# Patient Record
Sex: Female | Born: 1980 | Race: Black or African American | Hispanic: No | Marital: Married | Smoking: Never smoker
Health system: Southern US, Community
[De-identification: ages and names within clinical notes are randomized; demographics above are authoritative.]

## PROBLEM LIST (undated history)

## (undated) HISTORY — PX: BREAST BIOPSY: SHX20

---

## 2021-02-05 ENCOUNTER — Other Ambulatory Visit: Payer: Self-pay | Admitting: Obstetrics and Gynecology

## 2021-02-05 DIAGNOSIS — Z803 Family history of malignant neoplasm of breast: Secondary | ICD-10-CM

## 2021-02-06 ENCOUNTER — Other Ambulatory Visit: Payer: Self-pay | Admitting: Obstetrics and Gynecology

## 2021-02-06 DIAGNOSIS — Z1231 Encounter for screening mammogram for malignant neoplasm of breast: Secondary | ICD-10-CM

## 2021-02-07 ENCOUNTER — Ambulatory Visit
Admission: RE | Admit: 2021-02-07 | Discharge: 2021-02-07 | Disposition: A | Discharge status: Home / Self Care | Payer: Managed Care, Other (non HMO) | Source: Ambulatory Visit | Attending: Obstetrics and Gynecology | Admitting: Obstetrics and Gynecology

## 2021-02-07 ENCOUNTER — Other Ambulatory Visit: Payer: Self-pay

## 2021-02-07 DIAGNOSIS — Z1231 Encounter for screening mammogram for malignant neoplasm of breast: Secondary | ICD-10-CM

## 2021-02-11 ENCOUNTER — Ambulatory Visit (INDEPENDENT_AMBULATORY_CARE_PROVIDER_SITE_OTHER): Payer: Managed Care, Other (non HMO) | Admitting: Physician Assistant

## 2021-02-11 ENCOUNTER — Other Ambulatory Visit: Payer: Self-pay

## 2021-02-11 ENCOUNTER — Encounter: Payer: Self-pay | Admitting: Physician Assistant

## 2021-02-11 VITALS — BP 118/76 | HR 78 | Temp 97.9°F | Ht 62.5 in | Wt 150.0 lb

## 2021-02-11 DIAGNOSIS — L918 Other hypertrophic disorders of the skin: Secondary | ICD-10-CM

## 2021-02-11 DIAGNOSIS — E663 Overweight: Secondary | ICD-10-CM | POA: Diagnosis not present

## 2021-02-11 NOTE — Progress Notes (Signed)
Andrea Rosario is a 40 y.o. female is here to establish care.  I acted as a Neurosurgeon for [employer], PA-C Corky Mull, LPN   History of Present Illness:   Chief Complaint  Patient presents with  . Establish Care  . Nevus    HPI  Pt here to establish care today.  Nevus Pt would like a referral to Dermatology to have Nevus removed. Has several on her face. Used to see a Armed forces operational officer in [place]. Has been getting a lot of skin tags and would like these removed and is interested in. No prior skin cancers or pre-cancerous lesions.  Overweight Trying to work on diet and exercise. Works from home and feels like this is impacting her ability to be more structured. Would like to go on more walks but has difficulty carving out time for herself.   Health Maintenance Due  Topic Date Due  . Hepatitis C Screening  Never done  . HIV Screening  Never done  . PAP SMEAR-Modifier  Never done  . TETANUS/TDAP  03/14/2018  . INFLUENZA VACCINE  Never done  . COVID-19 Vaccine (3 - Booster for Moderna series) 09/25/2020    Past Medical History:  Diagnosis Date  . Vaginal delivery 03/25/2005, 06/19/2008     Social History   Tobacco Use  . Smoking status: Never Smoker  . Smokeless tobacco: Never Used  Vaping Use  . Vaping Use: Never used  Substance Use Topics  . Alcohol use: Yes    Alcohol/week: 1.0 standard drink    Types: 1 Glasses of wine per week  . Drug use: Never    Past Surgical History:  Procedure Laterality Date  . BREAST BIOPSY Right   . BREAST BIOPSY Left     Family History  Problem Relation Age of Onset  . Breast cancer Mother 22  . Diabetes Mother   . Diabetes Father   . Diabetes Sister   . Colon cancer Neg Hx     PMHx, SurgHx, SocialHx, FamHx, Medications, and Allergies were reviewed in the Visit Navigator and updated as appropriate.   There are no problems to display for this patient.   Social History   Tobacco Use  . Smoking status: Never Smoker   . Smokeless tobacco: Never Used  Vaping Use  . Vaping Use: Never used  Substance Use Topics  . Alcohol use: Yes    Alcohol/week: 1.0 standard drink    Types: 1 Glasses of wine per week  . Drug use: Never    Current Medications and Allergies:    Current Outpatient Medications:  .  DENTA 5000 PLUS 1.1 % CREA dental cream, PLEASE SEE ATTACHED FOR DETAILED DIRECTIONS, Disp: , Rfl:   No Known Allergies  Review of Systems   ROS Negative unless otherwise specified per HPI.  Vitals:   Vitals:   02/11/21 0847  BP: 118/76  Pulse: 78  Temp: 97.9 F (36.6 C)  TempSrc: Temporal  SpO2: 98%  Weight: 150 lb (68 kg)  Height: 5' 2.5" (1.588 m)     Body mass index is 27 kg/m.   Physical Exam:    Physical Exam Vitals and nursing note reviewed.  Constitutional:      General: She is not in acute distress.    Appearance: She is well-developed. She is not ill-appearing or toxic-appearing.  Cardiovascular:     Rate and Rhythm: Normal rate and regular rhythm.     Pulses: Normal pulses.     Heart sounds: Normal heart  sounds, S1 normal and S2 normal.     Comments: No LE edema Pulmonary:     Effort: Pulmonary effort is normal.     Breath sounds: Normal breath sounds.  Skin:    General: Skin is warm and dry.  Neurological:     Mental Status: She is alert.     GCS: GCS eye subscore is 4. GCS verbal subscore is 5. GCS motor subscore is 6.  Psychiatric:        Speech: Speech normal.        Behavior: Behavior normal. Behavior is cooperative.      Assessment and Plan:    [place] was seen today for establish care and nevus.  Diagnoses and all orders for this visit:  Skin tag Referral to dermatology placed today. -     Ambulatory referral to Dermatology  Overweight Encouraged regular exercise and time for self as able. Made a goal to try to go on walks two times a week during lunch hour. Follow-up for CPE.   CMA or LPN served as scribe during this visit. History,  Physical, and Plan performed by medical provider. The above documentation has been reviewed and is accurate and complete.  Time spent with patient today was 30 minutes which consisted of chart review, discussing diagnosis, work up, treatment answering questions and documentation.   Jarold Motto, PA-C [place], [place] 02/11/2021  Follow-up: No follow-ups on file.

## 2021-02-11 NOTE — Patient Instructions (Signed)
It was great to see you!  Sunscreen daily!  Try to get in a walk during the day -- maybe start two times a week.  Find a podcast or music that you look forward to if your husband can't join you.  Let's follow-up for a physical when you are do, sooner if you have concerns.  Take care,  Jarold Motto PA-C

## 2021-03-27 ENCOUNTER — Telehealth: Payer: Self-pay

## 2021-03-27 NOTE — Telephone Encounter (Signed)
Error

## 2021-05-01 ENCOUNTER — Other Ambulatory Visit: Payer: Self-pay

## 2021-05-01 ENCOUNTER — Ambulatory Visit
Admission: RE | Admit: 2021-05-01 | Discharge: 2021-05-01 | Disposition: A | Discharge status: Home / Self Care | Payer: Managed Care, Other (non HMO) | Source: Ambulatory Visit | Attending: Obstetrics and Gynecology | Admitting: Obstetrics and Gynecology

## 2021-05-01 DIAGNOSIS — Z803 Family history of malignant neoplasm of breast: Secondary | ICD-10-CM

## 2021-05-01 MED ORDER — GADOBUTROL 1 MMOL/ML IV SOLN
7.0000 mL | Freq: Once | INTRAVENOUS | Status: AC | PRN
  Administered 2021-05-01: 7 mL via INTRAVENOUS

## 2021-07-14 ENCOUNTER — Encounter: Payer: Self-pay | Admitting: Physician Assistant

## 2021-07-14 ENCOUNTER — Other Ambulatory Visit: Payer: Self-pay

## 2021-07-14 ENCOUNTER — Ambulatory Visit (INDEPENDENT_AMBULATORY_CARE_PROVIDER_SITE_OTHER): Payer: 59 | Admitting: Physician Assistant

## 2021-07-14 VITALS — BP 107/72 | HR 80 | Temp 98.0°F | Ht 62.5 in | Wt 152.2 lb

## 2021-07-14 DIAGNOSIS — Z131 Encounter for screening for diabetes mellitus: Secondary | ICD-10-CM

## 2021-07-14 DIAGNOSIS — S99922A Unspecified injury of left foot, initial encounter: Secondary | ICD-10-CM

## 2021-07-14 LAB — POCT GLYCOSYLATED HEMOGLOBIN (HGB A1C): Hemoglobin A1C: 4.9 % (ref 4.0–5.6)

## 2021-07-14 NOTE — Progress Notes (Signed)
Acute Office Visit  Subjective:    Patient ID: Andrea Rosario, female    DOB: 1981/01/02, 40 y.o.   MRN: [id number]  Chief Complaint  Patient presents with   Nail Problem    HPI Patient is in today for left great toenail issue. She states yesterday a large end of her toenail came off when she took her left heel off after church. No pain, numbness, bleeding, or known injury. Already noticed another nail growing underneath. States it just made her nervous and she wanted to make sure she didn't need to do anything differently. Also nervous about diabetes because it runs in her family and she hasn't been screened as far as she knows.   Past Medical History:  Diagnosis Date   Vaginal delivery 03/25/2005, 06/19/2008    Past Surgical History:  Procedure Laterality Date   BREAST BIOPSY Right    BREAST BIOPSY Left     Family History  Problem Relation Age of Onset   Breast cancer Mother 59   Diabetes Mother    Diabetes Father    Diabetes Sister    Colon cancer Neg Hx     Social History   Socioeconomic History   Marital status: Married    Spouse name: Not on file   Number of children: Not on file   Years of education: Not on file   Highest education level: Not on file  Occupational History   Not on file  Tobacco Use   Smoking status: Never   Smokeless tobacco: Never  Vaping Use   Vaping Use: Never used  Substance and Sexual Activity   Alcohol use: Yes    Alcohol/week: 1.0 standard drink    Types: 1 Glasses of wine per week   Drug use: Never   Sexual activity: Yes    Birth control/protection: None    Comment: Vasectomy  Other Topics Concern   Not on file  Social History Patent examiner for [employer]; focus on RVs; 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    No Known Allergies  Review of Systems REFER TO HPI FOR PERTINENT POSITIVES AND NEGATIVES     Objective:    Physical Exam Skin:    Comments: Left great toenail - distal end has fallen off, but another nail is growing well. N/V intact. No injry noted. ROM normal. Pulses good in foot.     BP 107/72   Pulse 80   Temp 98 F (36.7 C)   Ht 5' 2.5" (1.588 m)   Wt 152 lb 3.2 oz (69 kg)   SpO2 97%   BMI 27.39 kg/m  Wt Readings from Last 3 Encounters:  07/14/21 152 lb 3.2 oz (69 kg)  02/11/21 150 lb (68 kg)    Health Maintenance Due  Topic Date Due   Pneumococcal Vaccine 8-36 Years old (1 - PCV) Never done   HIV Screening  Never done   Hepatitis C Screening  Never done  PAP SMEAR-Modifier  Never done   TETANUS/TDAP  03/14/2018   COVID-19 Vaccine (3 - Moderna risk series) 04/23/2020   INFLUENZA VACCINE  06/30/2021    There are no preventive care reminders to display for this patient.   No results found for: TSH No results found for: WBC, HGB, HCT, MCV, PLT No results found for: NA, K, CHLORIDE, CO2, GLUCOSE, BUN, CREATININE, BILITOT, ALKPHOS, AST, ALT, PROT, ALBUMIN, CALCIUM, ANIONGAP, EGFR, GFR No results found for: CHOL No results found for: HDL No results found for: LDLCALC No results found for: TRIG No results found for: CHOLHDL No results found for: HGBA1C     Assessment & Plan:   Problem List Items Addressed This Visit   None Visit Diagnoses     Injury of toenail of left foot, initial encounter    -  Primary   Diabetes mellitus screening       Relevant Orders   POCT HgB A1C       1. Injury of toenail of left foot, initial encounter 2. Diabetes mellitus  screening Reassured patient today that it takes 6-12 months for toenails to regrow completely. No further work-up needed on this toe at this time. No imaging or antibiotics recommended. We did screen for diabetes as well and she was reassured about this too - Ha1c was 4.9. She will call if any other concerns.     Angellica Maddison M Tolulope Pinkett, PA-C

## 2021-07-14 NOTE — Patient Instructions (Signed)
Good news! You don't have diabetes. All of your nails look great and I do not see any signs of deficiencies. Give this nail 6-12 months to regrow. Call if any concerns.

## 2021-07-24 ENCOUNTER — Ambulatory Visit: Payer: Managed Care, Other (non HMO) | Admitting: Physician Assistant

## 2021-08-29 ENCOUNTER — Other Ambulatory Visit: Payer: Self-pay | Admitting: Obstetrics and Gynecology

## 2021-08-29 DIAGNOSIS — Z1231 Encounter for screening mammogram for malignant neoplasm of breast: Secondary | ICD-10-CM

## 2021-10-11 IMAGING — MR MR BREAST BILAT WO/W CM
8 of 12 series · 33 of 48 positions shown · IV contrast (7ML GADAVIST)
Comparison: Breast MRI 02/16/2020. Mammography 02/07/2021 and
earlier.

CLINICAL DATA: 39-year-old with a family history of breast cancer
in her mother who was diagnosed at age 45. High risk supplemental
screening. Personal history of benign core needle biopsy in each
breast while the patient was still living in [place].

LABS:  Not applicable.
EXAM:
BILATERAL BREAST MRI WITH AND WITHOUT CONTRAST
TECHNIQUE: Multiplanar, multisequence MR images of both breasts were obtained
prior to and following the intravenous administration of 7 ml of
Gadavist.

[Series 2: t2_tirm_tra ipat (a-p) · axial · 3.0mm · 0.70mm/px · 1 of 55 slices shown]
[im 1/55]
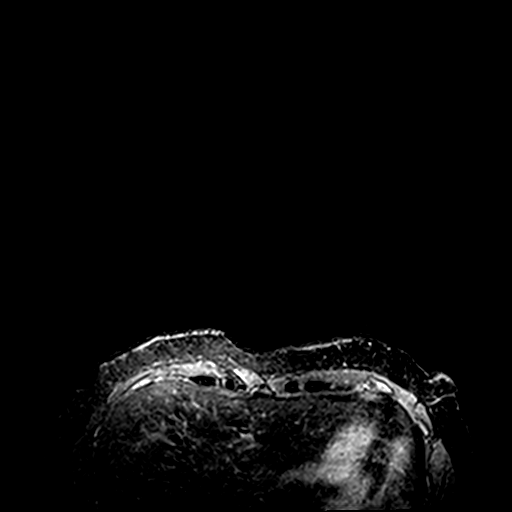

[Series 3: fl3d pre-cm no · axial · non-contrast · 1.2mm · 0.94mm/px · z∈[-82,+90]mm · 5 of 144 slices shown]
[im 1/144]
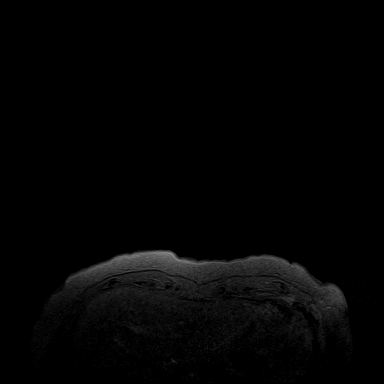
[im 36/144]
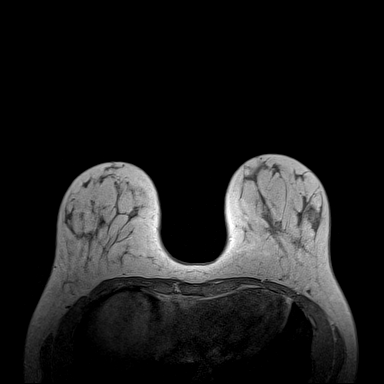
[im 72/144]
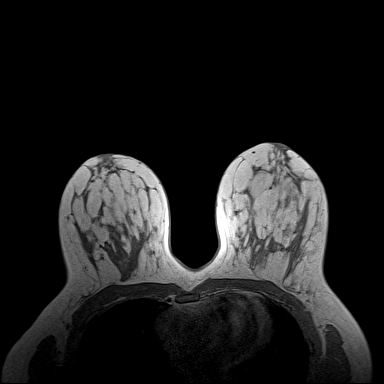
[im 108/144]
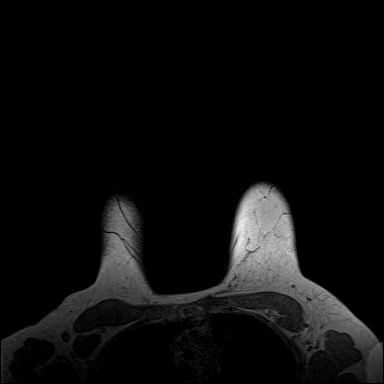
[im 144/144]
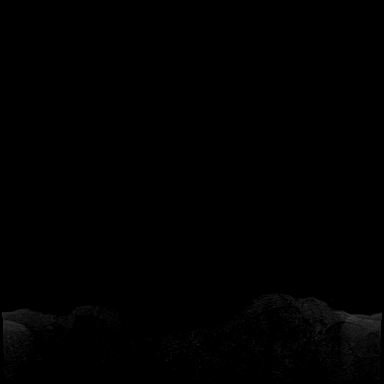

[Series 4: fl3d pre-cm · axial · non-contrast · 1.2mm · 0.94mm/px · z∈[-82,+90]mm · 5 of 144 slices shown]
[im 1/144]
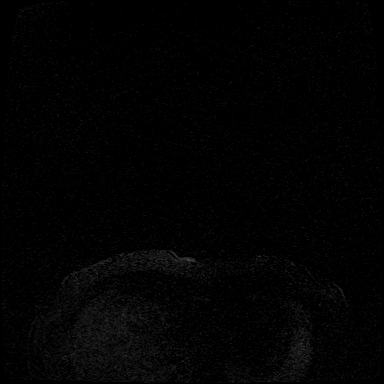
[im 36/144]
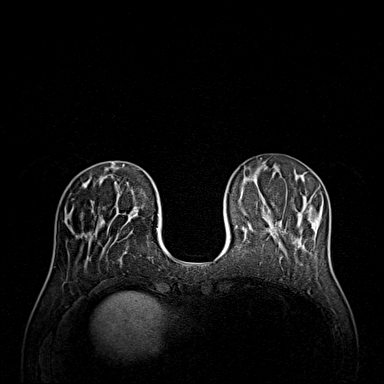
[im 72/144]
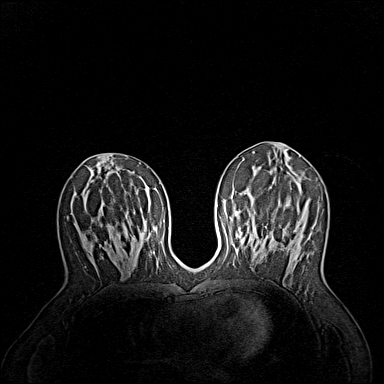
[im 108/144]
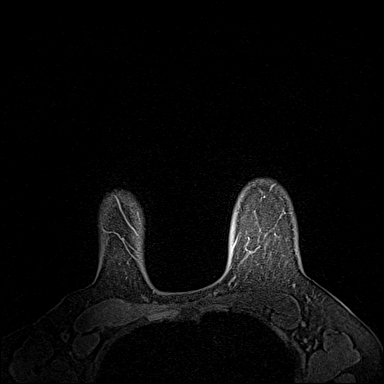
[im 144/144]
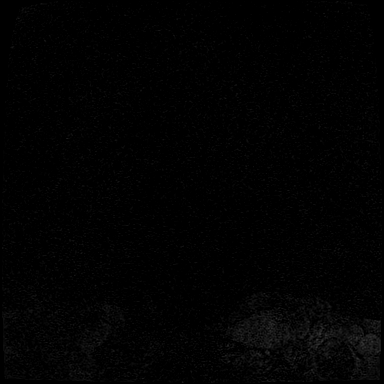

[Series 5: fl3d post immediate · axial · 1.2mm · 0.94mm/px · z∈[-82,+90]mm · 5 of 144 slices shown (1 of 3)]
[im 1/144]
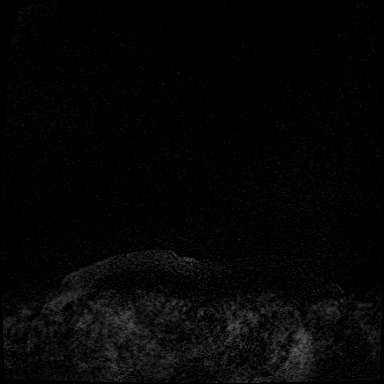
[im 36/144]
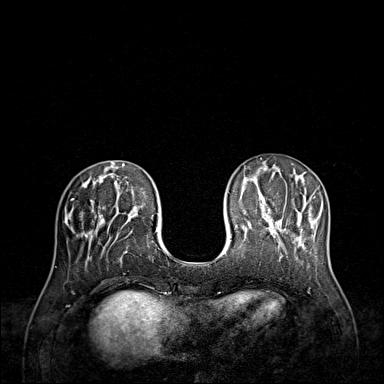
[im 72/144]
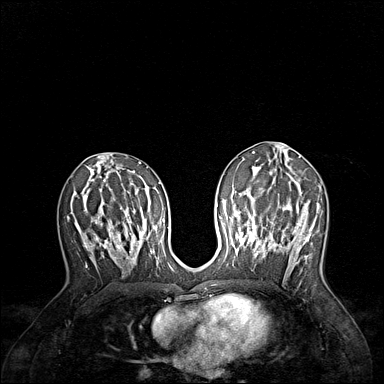
[im 108/144]
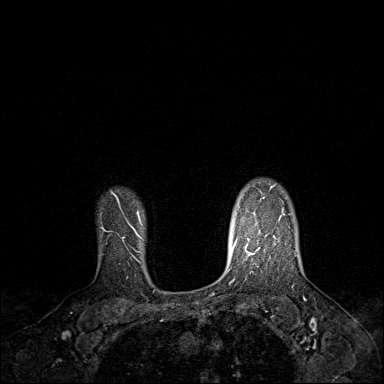
[im 144/144]
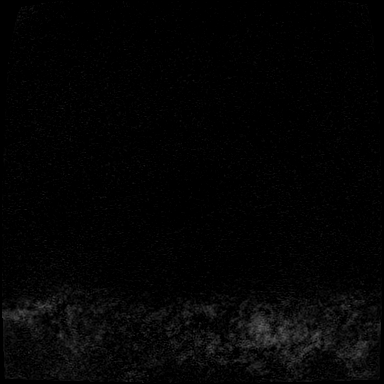

[Series 6: fl3d post immediate · axial · 1.2mm · 0.94mm/px · z∈[-82,+90]mm · 5 of 144 slices shown (2 of 3)]
[im 1/144]
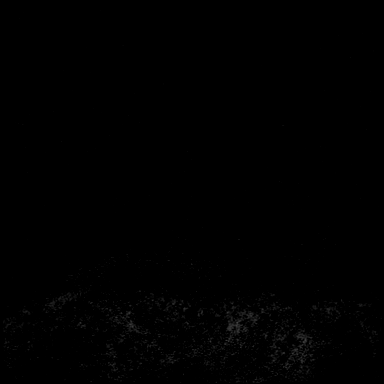
[im 36/144]
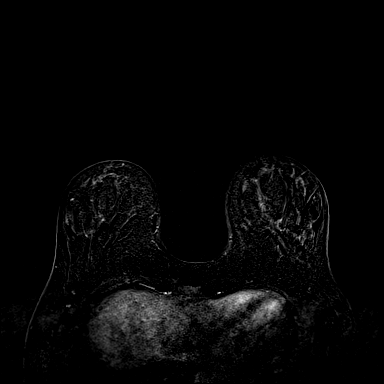
[im 72/144]
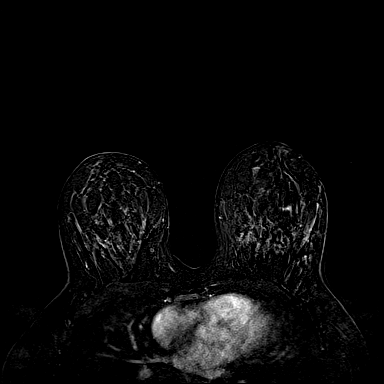
[im 108/144]
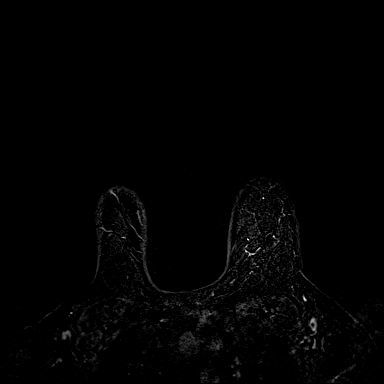
[im 144/144]
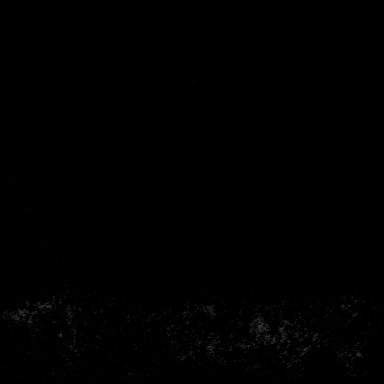

[Series 7: fl3d post immediate · axial · 172.8mm · 0.94mm/px · 1 of 1 slices shown (3 of 3)]
[im 1/1]
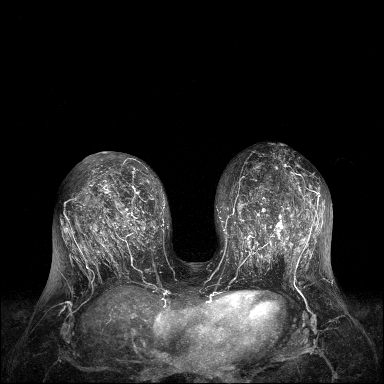

[Series 8: fl3d post 3min · axial · 1.2mm · 0.94mm/px · z∈[-82,+90]mm · 6 of 144 slices shown]
[im 1/144]
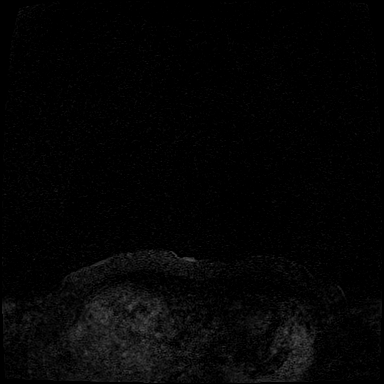
[im 29/144]
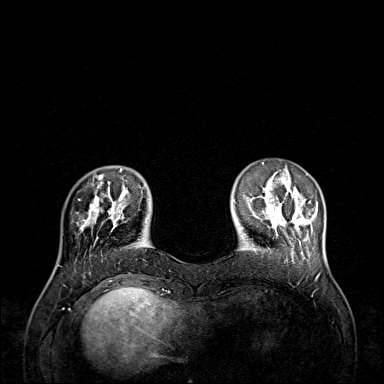
[im 58/144]
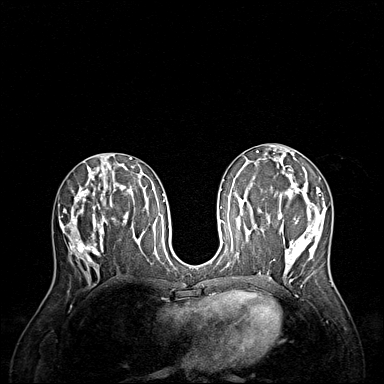
[im 86/144]
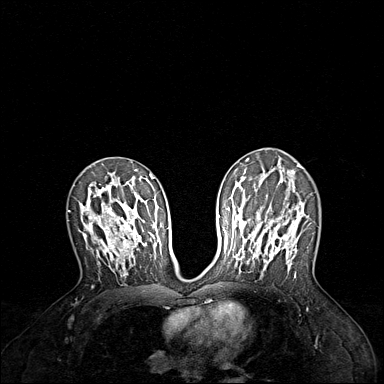
[im 115/144]
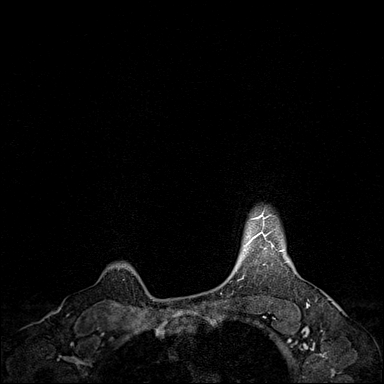
[im 144/144]
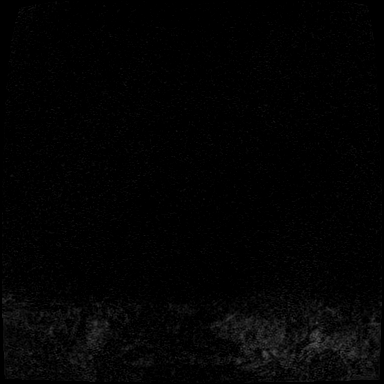

[Series 9: fl3d post 3min_sub · axial · 1.2mm · 0.94mm/px · z∈[-82,+55]mm · 5 of 144 slices shown]
[im 1/144]
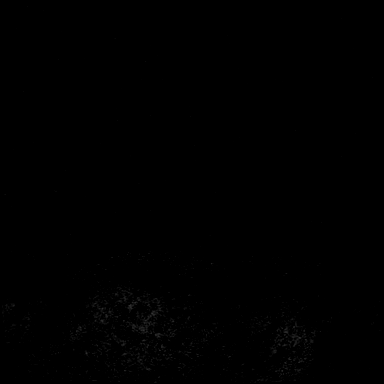
[im 29/144]
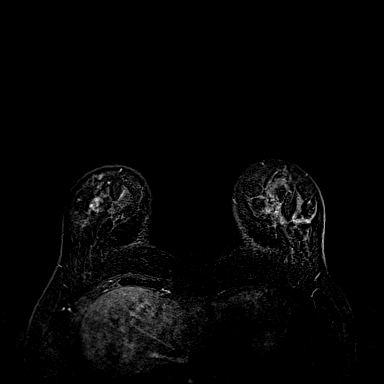
[im 58/144]
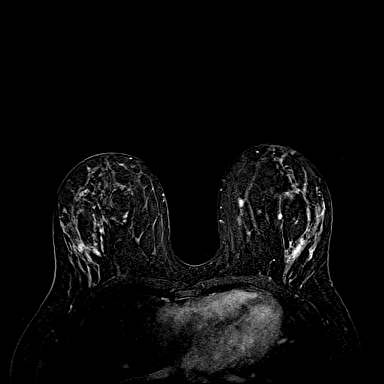
[im 86/144]
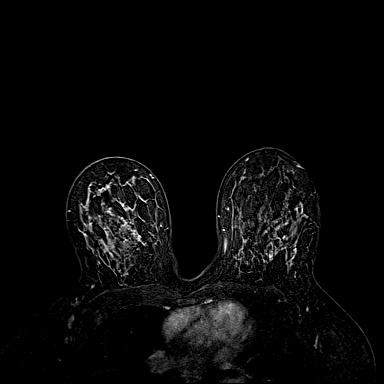
[im 115/144]
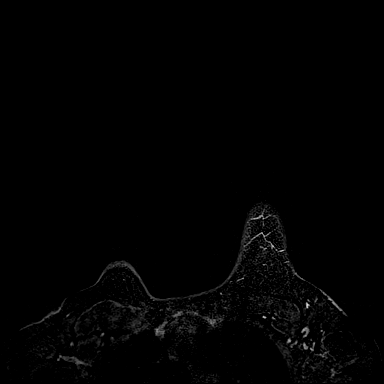

[33 of 48 positions shown; findings below may reference images not displayed]

Three-dimensional MR images were rendered by post-processing of the
original MR data on an independent workstation. The
three-dimensional MR images were interpreted, and findings are
reported in the following complete MRI report for this study. Three
dimensional images were evaluated at the independent interpreting
workstation using the DynaCAD thin client.
FINDINGS: Breast composition: c. Heterogeneous fibroglandular tissue.

Background parenchymal enhancement: Marked.

Right breast: No mass or abnormal enhancement.

Left breast: No mass or abnormal enhancement. Apparent focal
non-mass enhancement in the UPPER OUTER QUADRANT at middle to
posterior depth demonstrates benign progressive kinetics and there
is blooming artifact from a biopsy marker clip within the
enhancement.

Lymph nodes: No pathologic lymphadenopathy.

Ancillary findings:  None.
IMPRESSION: No MRI evidence of malignancy involving either breast.

RECOMMENDATION:
1. Annual screening mammography which is due in January 2022.
2. Supplemental high risk screening MRI in 1 year if the patient has
an estimated lifetime risk of greater than 20%.

BI-RADS CATEGORY  1: Negative.

## 2022-01-27 ENCOUNTER — Encounter: Payer: Self-pay | Admitting: Physician Assistant

## 2022-01-27 ENCOUNTER — Other Ambulatory Visit: Payer: Self-pay

## 2022-01-27 ENCOUNTER — Ambulatory Visit (INDEPENDENT_AMBULATORY_CARE_PROVIDER_SITE_OTHER): Payer: 59 | Admitting: Physician Assistant

## 2022-01-27 DIAGNOSIS — L219 Seborrheic dermatitis, unspecified: Secondary | ICD-10-CM | POA: Diagnosis not present

## 2022-01-27 DIAGNOSIS — L918 Other hypertrophic disorders of the skin: Secondary | ICD-10-CM | POA: Diagnosis not present

## 2022-01-27 MED ORDER — CICLOPIROX OLAMINE 0.77 % EX SUSP
1.0000 | Freq: Every day | CUTANEOUS | 4 refills | Status: DC
Start: 2022-01-27 — End: 2022-12-30

## 2022-01-27 MED ORDER — CLOBETASOL PROPIONATE 0.05 % EX SOLN: CUTANEOUS | 4 refills | Status: DC

## 2022-02-09 ENCOUNTER — Other Ambulatory Visit: Payer: Self-pay | Admitting: Obstetrics and Gynecology

## 2022-02-09 ENCOUNTER — Ambulatory Visit: Payer: 59

## 2022-02-09 DIAGNOSIS — Z803 Family history of malignant neoplasm of breast: Secondary | ICD-10-CM

## 2022-02-16 ENCOUNTER — Encounter: Payer: Self-pay | Admitting: Physician Assistant

## 2022-05-21 ENCOUNTER — Telehealth: Payer: Self-pay | Admitting: Physician Assistant

## 2022-05-21 MED ORDER — FLUOCINOLONE ACETONIDE BODY 0.01 % EX OIL: TOPICAL_OIL | CUTANEOUS | 2 refills | Status: DC

## 2022-05-21 NOTE — Telephone Encounter (Signed)
Patient called stating scalp still has build up and bleeding- using ciclopirox and clobetasol- per kelli sheffield okay to give patient derma-smooth oil. Sent to patient pharmacy.

## 2022-05-21 NOTE — Telephone Encounter (Signed)
Patient left message on office voice mail that the scalp medication that she was given is not working and she would like to discuss the issue(s) with someone.

## 2022-08-11 ENCOUNTER — Other Ambulatory Visit: Payer: 59

## 2022-08-24 ENCOUNTER — Encounter: Payer: Self-pay | Admitting: *Deleted

## 2022-08-25 ENCOUNTER — Ambulatory Visit
Admission: RE | Admit: 2022-08-25 | Discharge: 2022-08-25 | Disposition: A | Discharge status: Home / Self Care | Payer: 59 | Source: Ambulatory Visit | Attending: Obstetrics and Gynecology | Admitting: Obstetrics and Gynecology

## 2022-08-25 DIAGNOSIS — Z803 Family history of malignant neoplasm of breast: Secondary | ICD-10-CM

## 2022-08-25 MED ORDER — GADOBUTROL 1 MMOL/ML IV SOLN
6.0000 mL | Freq: Once | INTRAVENOUS | Status: AC | PRN
  Administered 2022-08-25: 6 mL via INTRAVENOUS

## 2022-11-12 ENCOUNTER — Encounter: Payer: Self-pay | Admitting: *Deleted

## 2022-12-24 ENCOUNTER — Encounter: Payer: 59 | Admitting: Physician Assistant

## 2022-12-29 NOTE — Progress Notes (Signed)
Subjective:    Andrea Rosario is a 42 y.o. female and is here for a comprehensive physical exam.  HPI  Health Maintenance Due  Topic Date Due   Hepatitis C Screening  Never done   DTaP/Tdap/Td (2 - Td or Tdap) 03/14/2018   INFLUENZA VACCINE  Never done    Acute Concerns: None  Chronic Issues: Family H/o Breast Cancer She receives regular bilateral breast MRIs as well as mammograms.  Health Maintenance: Immunizations -- Due for tetanus, Covid-19, flu vaccines. Colonoscopy -- N/A Mammogram -- Last completed 02/07/21. Results showed no mammographic evidence of malignancy. Recommended repeat in 2023. PAP -- UTD Bone Density -- N/A Diet -- Okay Exercise -- Frequent walking.  Sleep habits -- Good. Mood -- Okay  UTD with dentist? - UTD UTD with eye doctor? - UTD  Weight history: Wt Readings from Last 10 Encounters:  12/30/22 156 lb 6.1 oz (70.9 kg)  07/14/21 152 lb 3.2 oz (69 kg)  02/11/21 150 lb (68 kg)   Body mass index is 28.15 kg/m. Patient's last menstrual period was 12/21/2022 (exact date).  Alcohol use:  reports current alcohol use of about 1.0 standard drink of alcohol per week.  Tobacco use:  Tobacco Use: Low Risk  (12/30/2022)   Patient History    Smoking Tobacco Use: Never    Smokeless Tobacco Use: Never    Passive Exposure: Not on file   Eligible for lung cancer screening? no     12/30/2022    1:17 PM  Depression screen PHQ 2/9  Decreased Interest 0  Down, Depressed, Hopeless 0  PHQ - 2 Score 0     Other providers/specialists: Patient Care Team: Inda Coke, [place] as PCP - General (Physician Assistant) Sherlyn Hay, DO as Consulting Physician (Obstetrics and Gynecology) Warren Danes, PA-C as Physician Assistant (Dermatology)    PMHx, SurgHx, SocialHx, Medications, and Allergies were reviewed in the Visit Navigator and updated as appropriate.   Past Medical History:  Diagnosis Date   Vaginal delivery 03/25/2005,  06/19/2008     Past Surgical History:  Procedure Laterality Date   BREAST BIOPSY Right    BREAST BIOPSY Left      Family History  Problem Relation Age of Onset   Breast cancer Mother 50   Diabetes Mother    Diabetes Father    Diabetes Sister    Colon cancer Neg Hx     Social History   Tobacco Use   Smoking status: Never   Smokeless tobacco: Never  Vaping Use   Vaping Use: Never used  Substance Use Topics   Alcohol use: Yes    Alcohol/week: 1.0 standard drink of alcohol    Types: 1 Glasses of wine per week   Drug use: Never    Review of Systems:   Review of Systems  Constitutional:  Negative for chills, fever, malaise/fatigue and weight loss.  HENT:  Negative for hearing loss, sinus pain and sore throat.   Respiratory:  Negative for cough and hemoptysis.   Cardiovascular:  Negative for chest pain, palpitations, leg swelling and PND.  Gastrointestinal:  Negative for abdominal pain, constipation, diarrhea, heartburn, nausea and vomiting.  Genitourinary:  Negative for dysuria, frequency and urgency.  Musculoskeletal:  Negative for back pain, myalgias and neck pain.  Skin:  Negative for itching and rash.  Neurological:  Negative for dizziness, tingling, seizures and headaches.  Endo/Heme/Allergies:  Negative for polydipsia.  Psychiatric/Behavioral:  Negative for depression. The patient is not nervous/anxious.  Objective:   BP 110/74 (BP Location: Left Arm, Patient Position: Sitting, Cuff Size: Normal)   Pulse 84   Temp (!) 97.3 F (36.3 C) (Temporal)   Ht 5' 2.5" (1.588 m)   Wt 156 lb 6.1 oz (70.9 kg)   LMP 12/21/2022 (Exact Date)   SpO2 99%   BMI 28.15 kg/m  Body mass index is 28.15 kg/m.   General Appearance:    Alert, cooperative, no distress, appears stated age  Head:    Normocephalic, without obvious abnormality, atraumatic  Eyes:    PERRL, conjunctiva/corneas clear, EOM's intact, fundi    benign, both eyes  Ears:    Normal TM's and external ear  canals, both ears  Nose:   Nares normal, septum midline, mucosa normal, no drainage    or sinus tenderness  Throat:   Lips, mucosa, and tongue normal; teeth and gums normal  Neck:   Supple, symmetrical, trachea midline, no adenopathy;    thyroid:  no enlargement/tenderness/nodules; no carotid   bruit or JVD  Back:     Symmetric, no curvature, ROM normal, no CVA tenderness  Lungs:     Clear to auscultation bilaterally, respirations unlabored  Chest Wall:    No tenderness or deformity   Heart:    Regular rate and rhythm, S1 and S2 normal, no murmur, rub or gallop  Breast Exam:    Deferred  Abdomen:     Soft, non-tender, bowel sounds active all four quadrants,    no masses, no organomegaly  Genitalia:    Deferred  Extremities:   Extremities normal, atraumatic, no cyanosis or edema  Pulses:   2+ and symmetric all extremities  Skin:   Skin color, texture, turgor normal, no rashes or lesions  Lymph nodes:   Cervical, supraclavicular, and axillary nodes normal  Neurologic:   CNII-XII intact, normal strength, sensation and reflexes    throughout    Assessment/Plan:   Routine physical examination Today patient counseled on age appropriate routine health concerns for screening and prevention, each reviewed and up to date or declined. Immunizations reviewed and up to date or declined. Labs ordered and reviewed. Risk factors for depression reviewed and negative. Hearing function and visual acuity are intact. ADLs screened and addressed as needed. Functional ability and level of safety reviewed and appropriate. Education, counseling and referrals performed based on assessed risks today. Patient provided with a copy of personalized plan for preventive services.  Encounter for lipid screening for cardiovascular disease Update lipid panel and make recommendations accordingly  Overweight Continue healthy lifestyle efforts  Screening for HIV (human immunodeficiency virus) Update one time  screening  Encounter for screening for other viral diseases Update one time screening  I,Alexander Ruley,acting as a scribe for [employer], PA.,have documented all relevant documentation on the behalf of Inda Coke, PA,as directed by  Inda Coke, PA while in the presence of Inda Coke, [place].  I, Inda Coke, [place], have reviewed all documentation for this visit. The documentation on 12/30/22 for the exam, diagnosis, procedures, and orders are all accurate and complete.  Inda Coke, PA-C [place]

## 2022-12-30 ENCOUNTER — Encounter: Payer: Self-pay | Admitting: Physician Assistant

## 2022-12-30 ENCOUNTER — Ambulatory Visit (INDEPENDENT_AMBULATORY_CARE_PROVIDER_SITE_OTHER): Payer: 59 | Admitting: Physician Assistant

## 2022-12-30 VITALS — BP 110/74 | HR 84 | Temp 97.3°F | Ht 62.5 in | Wt 156.4 lb

## 2022-12-30 DIAGNOSIS — Z1159 Encounter for screening for other viral diseases: Secondary | ICD-10-CM

## 2022-12-30 DIAGNOSIS — Z23 Encounter for immunization: Secondary | ICD-10-CM | POA: Diagnosis not present

## 2022-12-30 DIAGNOSIS — E663 Overweight: Secondary | ICD-10-CM | POA: Diagnosis not present

## 2022-12-30 DIAGNOSIS — Z13220 Encounter for screening for lipoid disorders: Secondary | ICD-10-CM | POA: Diagnosis not present

## 2022-12-30 DIAGNOSIS — Z0000 Encounter for general adult medical examination without abnormal findings: Secondary | ICD-10-CM | POA: Diagnosis not present

## 2022-12-30 DIAGNOSIS — Z136 Encounter for screening for cardiovascular disorders: Secondary | ICD-10-CM

## 2022-12-30 DIAGNOSIS — Z114 Encounter for screening for human immunodeficiency virus [HIV]: Secondary | ICD-10-CM | POA: Diagnosis not present

## 2022-12-30 LAB — CBC WITH DIFFERENTIAL/PLATELET
Basophils Absolute: 0 10*3/uL (ref 0.0–0.1)
Basophils Relative: 0.8 % (ref 0.0–3.0)
Eosinophils Absolute: 0 10*3/uL (ref 0.0–0.7)
Eosinophils Relative: 0.6 % (ref 0.0–5.0)
HCT: 40.8 % (ref 36.0–46.0)
Hemoglobin: 13.7 g/dL (ref 12.0–15.0)
Lymphocytes Relative: 26 % (ref 12.0–46.0)
Lymphs Abs: 1.6 10*3/uL (ref 0.7–4.0)
MCHC: 33.6 g/dL (ref 30.0–36.0)
MCV: 89.3 fl (ref 78.0–100.0)
Monocytes Absolute: 0.6 10*3/uL (ref 0.1–1.0)
Monocytes Relative: 9.1 % (ref 3.0–12.0)
Neutro Abs: 3.8 10*3/uL (ref 1.4–7.7)
Neutrophils Relative %: 63.5 % (ref 43.0–77.0)
Platelets: 289 10*3/uL (ref 150.0–400.0)
RBC: 4.57 Mil/uL (ref 3.87–5.11)
RDW: 13.4 % (ref 11.5–15.5)
WBC: 6.1 10*3/uL (ref 4.0–10.5)

## 2022-12-30 LAB — COMPREHENSIVE METABOLIC PANEL
ALT: 26 U/L (ref 0–35)
AST: 19 U/L (ref 0–37)
Albumin: 4.1 g/dL (ref 3.5–5.2)
Alkaline Phosphatase: 64 U/L (ref 39–117)
BUN: 10 mg/dL (ref 6–23)
CO2: 31 mEq/L (ref 19–32)
Calcium: 9.4 mg/dL (ref 8.4–10.5)
Chloride: 104 mEq/L (ref 96–112)
Creatinine, Ser: 0.69 mg/dL (ref 0.40–1.20)
GFR: 106.99 mL/min (ref >60.00)
Glucose, Bld: 86 mg/dL (ref 70–99)
Potassium: 4.1 mEq/L (ref 3.5–5.1)
Sodium: 139 mEq/L (ref 135–145)
Total Bilirubin: 0.3 mg/dL (ref 0.2–1.2)
Total Protein: 6.8 g/dL (ref 6.0–8.3)

## 2022-12-30 LAB — LIPID PANEL
Cholesterol: 203 mg/dL — ABNORMAL HIGH (ref 0–200)
HDL: 74.1 mg/dL (ref >39.00)
LDL Cholesterol: 113 mg/dL — ABNORMAL HIGH (ref 0–99)
NonHDL: 129.27
Total CHOL/HDL Ratio: 3
Triglycerides: 81 mg/dL (ref 0.0–149.0)
VLDL: 16.2 mg/dL (ref 0.0–40.0)

## 2022-12-30 NOTE — Patient Instructions (Addendum)
It was great to see you!  Consider taking caltrate 600mg  + D twice daily -- generic alternative is fine!  Please go to the lab for blood work.   Our office will call you with your results unless you have chosen to receive results via MyChart.  If your blood work is normal we will follow-up each year for physicals and as scheduled for chronic medical problems.  If anything is abnormal we will treat accordingly and get you in for a follow-up.  Take care,  Aldona Bar

## 2022-12-30 NOTE — Addendum Note (Signed)
Addended by: Marian Sorrow on: 12/30/2022 02:36 PM   Modules accepted: Orders

## 2022-12-31 LAB — HIV ANTIBODY (ROUTINE TESTING W REFLEX): HIV 1&2 Ab, 4th Generation: NONREACTIVE

## 2022-12-31 LAB — HEPATITIS C ANTIBODY: Hepatitis C Ab: NONREACTIVE

## 2023-01-21 ENCOUNTER — Encounter: Payer: Self-pay | Admitting: Physician Assistant

## 2023-02-02 ENCOUNTER — Ambulatory Visit: Payer: 59 | Admitting: Physician Assistant

## 2023-03-10 ENCOUNTER — Telehealth: Payer: Self-pay | Admitting: Physician Assistant

## 2023-03-10 LAB — HM MAMMOGRAPHY

## 2023-03-10 NOTE — Telephone Encounter (Signed)
Spoke to pt told her per [facility], lab results from 12/30/2022 were overall normal. Your cholesterol is borderline elevated, but not to the point were we need to start medication. We can recheck in one year. Keep up the good work and continue to strive towards healthy diet and regular exercise as you are able to do. Pt verbalized understanding and asked if I could fax labs to GYN. Told her I will fax results over. Pt verbalized understanding. Labs faxed to [phone] [facility] office.

## 2023-03-10 NOTE — Telephone Encounter (Signed)
Pt would like a call back with lab results 

## 2023-05-24 NOTE — Progress Notes (Signed)
Andrea Rosario is a 42 y.o. female here for a new problem.  History of Present Illness:   No chief complaint on file.   HPI  Eczema   Requesting dermatology referral.     Past Medical History:  Diagnosis Date   Vaginal delivery 03/25/2005, 06/19/2008     Social History   Tobacco Use   Smoking status: Never   Smokeless tobacco: Never  Vaping Use   Vaping Use: Never used  Substance Use Topics   Alcohol use: Yes    Alcohol/week: 1.0 standard drink of alcohol    Types: 1 Glasses of wine per week   Drug use: Never    Past Surgical History:  Procedure Laterality Date   BREAST BIOPSY Right    BREAST BIOPSY Left     Family History  Problem Relation Age of Onset   Breast cancer Mother 33   Diabetes Mother    Diabetes Father    Diabetes Sister    Colon cancer Neg Hx     No Known Allergies  Current Medications:   Current Outpatient Medications:    PREVIDENT 5000 BOOSTER PLUS 1.1 % PSTE, Place onto teeth., Disp: , Rfl:    Review of Systems:   ROS  Vitals:   There were no vitals filed for this visit.   There is no height or weight on file to calculate BMI.  Physical Exam:   Physical Exam  Assessment and Plan:   ***   I,Alexander Ruley,acting as a scribe for Jarold Motto, PA.,have documented all relevant documentation on the behalf of Jarold Motto, PA,as directed by  Jarold Motto, PA while in the presence of Jarold Motto, [place].   ***   Jarold Motto, PA-C

## 2023-05-26 ENCOUNTER — Encounter: Payer: Self-pay | Admitting: Physician Assistant

## 2023-05-26 ENCOUNTER — Ambulatory Visit (INDEPENDENT_AMBULATORY_CARE_PROVIDER_SITE_OTHER): Payer: 59 | Admitting: Physician Assistant

## 2023-05-26 VITALS — BP 110/76 | HR 83 | Temp 97.7°F | Ht 62.5 in | Wt 155.2 lb

## 2023-05-26 DIAGNOSIS — L219 Seborrheic dermatitis, unspecified: Secondary | ICD-10-CM | POA: Diagnosis not present

## 2023-05-26 DIAGNOSIS — H9193 Unspecified hearing loss, bilateral: Secondary | ICD-10-CM

## 2023-05-26 MED ORDER — KETOCONAZOLE 2 % EX SHAM: MEDICATED_SHAMPOO | CUTANEOUS | 0 refills | Status: DC

## 2023-05-26 NOTE — Patient Instructions (Signed)
It was great to see you!  I'm referring you to Dr Langston Reusing with [facility] Please call to schedule [phone]   Use the ketoconazole shampoo weekly, then may use the zinc shampoo in between       Take care,  Jarold Motto PA-C

## 2023-06-02 ENCOUNTER — Encounter: Payer: Self-pay | Admitting: Physician Assistant

## 2023-08-03 ENCOUNTER — Ambulatory Visit (INDEPENDENT_AMBULATORY_CARE_PROVIDER_SITE_OTHER): Payer: 59 | Admitting: Dermatology

## 2023-08-03 ENCOUNTER — Encounter: Payer: Self-pay | Admitting: Dermatology

## 2023-08-03 DIAGNOSIS — L219 Seborrheic dermatitis, unspecified: Secondary | ICD-10-CM

## 2023-08-03 MED ORDER — ZORYVE 0.3 % EX FOAM: 1.0000 | Freq: Every day | CUTANEOUS | 2 refills | Status: DC

## 2023-08-03 MED ORDER — FLUCONAZOLE 150 MG PO TABS: 150.0000 mg | ORAL_TABLET | ORAL | 0 refills | Status: DC

## 2023-08-03 NOTE — Progress Notes (Signed)
   New Patient Visit   Subjective  Andrea Rosario is a 42 y.o. female who presents for the following: seborrheic dermatitis at scalp right > left side. Patient has used ketoconazole 2% shampoo, clobetasol solution, ciclopirox but none have helped. Patient currently using DHS zinc shampoo OTC letting it it for a few minutes which helps as well as OTC apple cider vinegar serum. Patient also taking supplement for hair, skin and nails. Her scalp continues to itch. Patient advises today is a good day.  The patient has spots, moles and lesions to be evaluated, some may be new or changing and the patient may have concern these could be cancer.   The following portions of the chart were reviewed this encounter and updated as appropriate: medications, allergies, medical history  Review of Systems:  No other skin or systemic complaints except as noted in HPI or Assessment and Plan.  Objective  Well appearing patient in no apparent distress; mood and affect are within normal limits.   A focused examination was performed of the following areas: scalp  Relevant exam findings are noted in the Assessment and Plan.    Assessment & Plan   SEBORRHEIC DERMATITIS Exam: Pink patches with greasy scale at scalp  Chronic and persistent condition with duration or expected duration over one year. Condition is symptomatic/ bothersome to patient. Not currently at goal.  Pt Education: Seborrheic Dermatitis is a chronic persistent rash characterized by pinkness and scaling most commonly of the mid face but also can occur on the scalp (dandruff), ears; mid chest, mid back and groin.  It tends to be exacerbated by stress and cooler weather.  People who have neurologic disease may experience new onset or exacerbation of existing seborrheic dermatitis.  The condition is not curable but treatable and can be controlled.   - Assessment: Patient has a history of seborrheic dermatitis, predominantly on the right side of  the scalp. Previous treatments with ketoconazole shampoo, clobetasol, and ciclopirox have not led to significant improvement. Currently utilizing DHS Zinc shampoo and apple cider vinegar. - Plan:   a. Prescribe Zoryve Foam for daily application up to two months to alleviate inflammation and restore skin barrier. Prescription to be processed and mailed by [facility].   b. Prescribe oral antifungal fluconazole 150 mg, one tablet weekly, to address yeast overgrowth.   c. Advise continuation of DHS Zinc shampoo alongside Zoryve Foam for enhanced outcomes.   d. Recommend avoiding scalp oils to prevent exacerbation of seborrheic dermatitis; suggest applying oils only to hair ends.   e. Schedule follow-up in early December to evaluate treatment progress and prepare for potential winter exacerbations.    Side effects of fluconazole (diflucan) include nausea, diarrhea, headache, dizziness, taste changes, rare risk of irritation of the liver, allergy, or decreased blood counts (which could show up as infection or tiredness).  Seborrheic dermatitis  Related Medications Roflumilast, Antiseborrheic, (ZORYVE) 0.3 % FOAM Apply 1 Application topically daily. As needed for itch    Return in about 3 months (around 11/02/2023) for Seb Derm.  Anise Salvo, RMA, am acting as scribe for [employer], DO .   Documentation: I have reviewed the above documentation for accuracy and completeness, and I agree with the above.  Langston Reusing, DO

## 2023-08-03 NOTE — Patient Instructions (Addendum)
Hello Ms. Andrea Rosario,  Thank you for visiting my office today. Your dedication to managing your seborrheic dermatitis and improving your overall health is greatly appreciated. Below is a summary of our discussion and the treatment plan we have established:  Seborrheic Dermatitis Treatment Plan: - Medications Prescribed:   - Zoryve Foam: Apply daily to the affected areas on the scalp. This treatment should be used for up to two months to help calm inflammation and repair the skin barrier.   - Fluconazole 150 mg: Take one tablet weekly to combat yeast overgrowth.  - Shampoo Routine:   - Continue with the DHS Zinc shampoo regimen. Apply, allow it to sit for 3-5 minutes, then rinse thoroughly.   - Maintain shampooing frequency at every 2-3 days, as you are currently doing.  - Lifestyle and Hair Care Adjustments:   - Avoid the direct application of oils to the scalp, as they can worsen seborrheic dermatitis. Oils such as olive and coconut should be particularly avoided.   - When using heat-protectant products for hair straightening, ensure they have minimal contact with the scalp.  - Follow-Up Appointment:   - Please schedule a follow-up visit in early December. This will allow [place] to assess the effectiveness of your treatment and make any necessary adjustments for the winter season.  - Pharmacy Coordination:   - Your prescription for Zoryve Foam will be forwarded to [facility]. They will reach out to you regarding insurance details. Should there be any issues with coverage, inform us so we can explore alternative options.  - General Advice:   - Continue to stay hydrated, as it supports overall skin health.  Please monitor your symptoms and do not hesitate to contact us if you notice any changes or have concerns before our next scheduled appointment. Should you not hear from the pharmacy as expected, please reach out to [place] through MyChart.  Looking forward to our next meeting in December.  It was a pleasure to meet you today.  Best regards,  Dr. Langston Reusing Dermatology       Treatment Plan: [facility] foam daily to scalp as needed.  Start fluconazole 150 mg once weekly Continue DHS Zinc shampoo massaging into scalp and let sit 3-5 minutes before rinsing.  Avoid oils to scalp.   Important Information  Due to recent changes in healthcare laws, you may see results of your pathology and/or laboratory studies on MyChart before the doctors have had a chance to review them. We understand that in some cases there may be results that are confusing or concerning to you. Please understand that not all results are received at the same time and often the doctors may need to interpret multiple results in order to provide you with the best plan of care or course of treatment. Therefore, we ask that you please give [place] 2 business days to thoroughly review all your results before contacting the office for clarification. Should we see a critical lab result, you will be contacted sooner.   If You Need Anything After Your Visit  If you have any questions or concerns for your doctor, please call our main line at [phone] If no one answers, please leave a voicemail as directed and we will return your call as soon as possible. Messages left after 4 pm will be answered the following business day.   You may also send [place] a message via MyChart. We typically respond to MyChart messages within 1-2 business days.  For prescription refills, please ask  your pharmacy to contact our office. Our fax number is [phone].  If you have an urgent issue when the clinic is closed that cannot wait until the next business day, you can page your doctor at the number below.    Please note that while we do our best to be available for urgent issues outside of office hours, we are not available 24/7.   If you have an urgent issue and are unable to reach [place], you may choose to seek medical care at your  doctor's office, retail clinic, urgent care center, or emergency room.  If you have a medical emergency, please immediately call 911 or go to the emergency department. In the event of inclement weather, please call our main line at [phone] for an update on the status of any delays or closures.  Dermatology Medication Tips: Please keep the boxes that topical medications come in in order to help keep track of the instructions about where and how to use these. Pharmacies typically print the medication instructions only on the boxes and not directly on the medication tubes.   If your medication is too expensive, please contact our office at [phone] or send [place] a message through MyChart.   We are unable to tell what your co-pay for medications will be in advance as this is different depending on your insurance coverage. However, we may be able to find a substitute medication at lower cost or fill out paperwork to get insurance to cover a needed medication.   If a prior authorization is required to get your medication covered by your insurance company, please allow [place] 1-2 business days to complete this process.  Drug prices often vary depending on where the prescription is filled and some pharmacies may offer cheaper prices.  The website www.goodrx.com contains coupons for medications through different pharmacies. The prices here do not account for what the cost may be with help from insurance (it may be cheaper with your insurance), but the website can give you the price if you did not use any insurance.  - You can print the associated coupon and take it with your prescription to the pharmacy.  - You may also stop by our office during regular business hours and pick up a GoodRx coupon card.  - If you need your prescription sent electronically to a different pharmacy, notify our office through [facility] or by phone at [phone]

## 2023-08-10 ENCOUNTER — Telehealth: Payer: Self-pay

## 2023-08-10 NOTE — Telephone Encounter (Signed)
Pt left VM requesting additional info about a medication prescribed at her previous visit. I returned pt's call and she wanted to ensure the diflucan was prescribed for her hair sxs. I advised pt that the diflucan controls the over growth of yeast that can happen anywhere on the scalp and/or body not just for vaginal yeast infections. Pt voiced her understanding.

## 2023-08-18 DIAGNOSIS — N945 Secondary dysmenorrhea: Secondary | ICD-10-CM | POA: Diagnosis not present

## 2023-08-18 DIAGNOSIS — R102 Pelvic and perineal pain: Secondary | ICD-10-CM | POA: Diagnosis not present

## 2023-08-18 DIAGNOSIS — N920 Excessive and frequent menstruation with regular cycle: Secondary | ICD-10-CM | POA: Diagnosis not present

## 2023-08-19 DIAGNOSIS — H5213 Myopia, bilateral: Secondary | ICD-10-CM | POA: Diagnosis not present

## 2023-11-02 ENCOUNTER — Encounter: Payer: Self-pay | Admitting: Dermatology

## 2023-11-02 ENCOUNTER — Ambulatory Visit: Payer: 59 | Admitting: Dermatology

## 2023-11-02 DIAGNOSIS — L219 Seborrheic dermatitis, unspecified: Secondary | ICD-10-CM | POA: Diagnosis not present

## 2023-11-02 MED ORDER — ZORYVE 0.3 % EX FOAM: 1.0000 | Freq: Every day | CUTANEOUS | 2 refills | Status: DC

## 2023-11-02 NOTE — Patient Instructions (Addendum)
  Hello Ledia,  Thank you for visiting [place] today. Your dedication to managing your seborrheic dermatitis and improving your overall health is greatly appreciated. Here is a summary of the key instructions from today's consultation:  - DHS Zinc Shampoo and Zoryve Foam: Continue using these as previously prescribed.   - T-Sal Shampoo: Introduce T-Sal shampoo by [employer] to your routine to help with flaking. Use this in conjunction with the DHS zinc shampoo.  - Styling Products: Replace pink oil and gel with Love Your Curls mixed with Biolage Gelee to reduce scalp irritation.  - Co-Washing: Implement co-washing between shampoo days to prevent over-drying of your scalp.  - Heat Protectant: Continue Avoiding using heat protectant near the scalp to prevent irritation but continue using it on the hair when flat ironing.  - Prescription Update: A new prescription for Zoryve foam with four refills has been sent to your pharmacy.  - Follow-Up Appointment: Scheduled in two months to evaluate progress. We will discuss potential injections if itching persists and consider a scalp biopsy if symptoms of CCCA are suspected.  Please follow these instructions carefully and observe how your scalp responds to the changes. If you have any questions or concerns before our next appointment, please do not hesitate to contact our office.  Wishing you a wonderful Christmas and looking forward to seeing you again soon.  Best regards,  Dr. Langston Reusing, Dermatologist

## 2023-11-02 NOTE — Progress Notes (Signed)
   Follow-Up Visit   Subjective  Andrea Rosario is a 42 y.o. female who presents for the following: Seborrheic dermatitis follow up - She is frustrated because her scalp is no better. She was able to get Zoryve foam and it helps with itch but not with the dandruff. She took Fluconazole 150 mg 1 tablet weekly for 4 weeks. She is using DHS Zinc shampoo and DHS conditioner every 3-5 days.Itching is mostly on the right scalp, posterior scalp and crown scalp. She does use oil on her hair when she flat irons her hair but she does not apply it to her scalp.    The following portions of the chart were reviewed this encounter and updated as appropriate: medications, allergies, medical history  Review of Systems:  No other skin or systemic complaints except as noted in HPI or Assessment and Plan.  Objective  Well appearing patient in no apparent distress; mood and affect are within normal limits.   A focused examination was performed of the following areas: Scalp  Relevant exam findings are noted in the Assessment and Plan.    Assessment & Plan   1. Seborrheic Dermatitis - Assessment: Patient presents with persistent scalp itching and flaking in specific areas (top, side, and back). Previous treatments including DHS zinc, ketoconazole shampoo, clobetasol, and ciclopirox were ineffective. Current treatment with Zoryve foam provides relief but does not fully resolve symptoms. The patient expresses frustration with ongoing scalp issues. - Plan: Continue Zoryve foam, allowing use twice daily as needed. Introduce T-Sal (salicylic acid) shampoo for use alongside DHS zinc shampoo on shampoo days. Discontinue pink oil. Implement co-washing between shampoo days. Issue a new prescription for Zoryve foam with 4 refills. Schedule a follow-up appointment in 2 months. Consider scalp injections and biopsy if symptoms persist.  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 Chronic itching could potentially lead to hair loss and scarring, consistent with CCCA. - Plan: Monitor for progression of symptoms. Consider a scalp biopsy at the follow-up if symptoms persist.     Seborrheic dermatitis  Related Medications Roflumilast, Antiseborrheic, (ZORYVE) 0.3 % FOAM Apply 1 Application topically daily. As needed for itch    Return in about 2 months (around 01/03/2024) for seb derm.  I, Andrea Rosario, CMA, am acting as scribe for [employer], DO .   Documentation: I have reviewed the above documentation for accuracy and completeness, and I agree with the above.  Andrea Reusing, DO

## 2024-01-04 ENCOUNTER — Encounter: Payer: Self-pay | Admitting: Dermatology

## 2024-01-04 ENCOUNTER — Ambulatory Visit (INDEPENDENT_AMBULATORY_CARE_PROVIDER_SITE_OTHER): Payer: 59 | Admitting: Dermatology

## 2024-01-04 DIAGNOSIS — L219 Seborrheic dermatitis, unspecified: Secondary | ICD-10-CM | POA: Diagnosis not present

## 2024-01-04 MED ORDER — ZORYVE 0.3 % EX FOAM: 1.0000 | Freq: Every day | CUTANEOUS | 2 refills | Status: AC

## 2024-01-04 NOTE — Patient Instructions (Addendum)
 Hello Caryn,  Thank you for visiting us  today.   Here is a summary of the key instructions from today's consultation:  Medication Adjustments:   Restart Zoryve  foam, to be used once daily. We will send the prescription to [facility] in [place] to ensure affordability.   Continue incorporating T-Cell products into your daily routine.  Shampoo Recommendations:   Utilize DHS Zinc Shampoo in conjunction with T-Cell shampoo to manage flaking effectively. This shampoo can be found at [facility] for your convenience.  Follow-Up and Monitoring:   We have scheduled a follow-up appointment for August to evaluate any changes that may occur due to seasonal variations.   Should the cost of Zoryve  foam become a concern, please contact us  via MyChart to discuss alternative options, such as Derma-Smooth Oil.   We appreciate your proactive approach to managing your scalp health. Should you have any questions or concerns before our next meeting, please feel free to reach out.  Warm regards,  Dr. Delon Lenis, Dermatology     Important Information  Due to recent changes in healthcare laws, you may see results of your pathology and/or laboratory studies on MyChart before the doctors have had a chance to review them. We understand that in some cases there may be results that are confusing or concerning to you. Please understand that not all results are received at the same time and often the doctors may need to interpret multiple results in order to provide you with the best plan of care or course of treatment. Therefore, we ask that you please give us  2 business days to thoroughly review all your results before contacting the office for clarification. Should we see a critical lab result, you will be contacted sooner.   If You Need Anything After Your Visit  If you have any questions or concerns for your doctor, please call our main line at [phone] If no one answers, please  leave a voicemail as directed and we will return your call as soon as possible. Messages left after 4 pm will be answered the following business day.   You may also send us  a message via MyChart. We typically respond to MyChart messages within 1-2 business days.  For prescription refills, please ask your pharmacy to contact our office. Our fax number is [phone].  If you have an urgent issue when the clinic is closed that cannot wait until the next business day, you can page your doctor at the number below.    Please note that while we do our best to be available for urgent issues outside of office hours, we are not available 24/7.   If you have an urgent issue and are unable to reach us , you may choose to seek medical care at your doctor's office, retail clinic, urgent care center, or emergency room.  If you have a medical emergency, please immediately call 911 or go to the emergency department. In the event of inclement weather, please call our main line at [phone] for an update on the status of any delays or closures.  Dermatology Medication Tips: Please keep the boxes that topical medications come in in order to help keep track of the instructions about where and how to use these. Pharmacies typically print the medication instructions only on the boxes and not directly on the medication tubes.   If your medication is too expensive, please contact our office at [phone] or send us  a message through MyChart.   We are unable to tell  what your co-pay for medications will be in advance as this is different depending on your insurance coverage. However, we may be able to find a substitute medication at lower cost or fill out paperwork to get insurance to cover a needed medication.   If a prior authorization is required to get your medication covered by your insurance company, please allow us  1-2 business days to complete this process.  Drug prices often vary depending on where the  prescription is filled and some pharmacies may offer cheaper prices.  The website www.goodrx.com contains coupons for medications through different pharmacies. The prices here do not account for what the cost may be with help from insurance (it may be cheaper with your insurance), but the website can give you the price if you did not use any insurance.  - You can print the associated coupon and take it with your prescription to the pharmacy.  - You may also stop by our office during regular business hours and pick up a GoodRx coupon card.  - If you need your prescription sent electronically to a different pharmacy, notify our office through [facility] or by phone at [phone]

## 2024-01-04 NOTE — Progress Notes (Signed)
   Follow-Up Visit   Subjective  Andrea Rosario is a 43 y.o. female who presents for the following: Seb Derm  Patient present today for follow up visit. Patient was last evaluated on 11/02/23. Patient reports sxs are unchanged. Pt stated that she did not purchase Zorvye due to cost with her new insurance was $900. Pt has only been using the recommended shampoos DHS & T/Sal. Patient denies medication changes.  The following portions of the chart were reviewed this encounter and updated as appropriate: medications, allergies, medical history  Review of Systems:  No other skin or systemic complaints except as noted in HPI or Assessment and Plan.  Objective  Well appearing patient in no apparent distress; mood and affect are within normal limits.   A focused examination was performed of the following areas: scalp   Relevant exam findings are noted in the Assessment and Plan.    Assessment & Plan   SEBORRHEIC DERMATITIS Exam: Pink patches with greasy scale at scalp  flared  Assessment: Patient previously used roflumilast , which provided partial relief but did not completely resolve symptoms. Burning sensation has resolved, but flaking has worsened. Oral fluconazole  was ineffective. No clinical signs of scarring or hair loss observed. Recent temperature fluctuations may be contributing to flare-ups.  Plan - Restart Zoryve  (roflumilast ) foam once daily, to be obtained from [facility] in [place] - Use DHS zinc shampoo (2% zinc) in combination with T-Cell shampoo - Continue using current hair products (Love Your Curls and Biolage gel) - Samples of CeraVe zinc shampoo (1% zinc) provided as an alternative - If Zoryve  foam is cost-prohibitive, consider switching to Derma-Smooth Oil - Follow up in August for reassessment, coinciding with expected seasonal changes   No follow-ups on file.    Documentation: I have reviewed the above documentation for accuracy and completeness,  and I agree with the   I, Shirron Maranda, CMA, am acting as scribe for Delon Lenis, DO.   Delon Lenis, DO

## 2024-01-05 ENCOUNTER — Encounter: Payer: 59 | Admitting: Physician Assistant

## 2024-01-10 ENCOUNTER — Ambulatory Visit: Payer: Self-pay | Admitting: Physician Assistant

## 2024-01-10 NOTE — Telephone Encounter (Signed)
 Copied from CRM [id number]. Topic: Clinical - Red Word Triage >> Jan 10, 2024  9:31 AM Isabell A wrote: Red Word that prompted transfer to Nurse Triage: Patient states she's been experiencing a cough with chest tightness, states its been hurting more in the night.  Chief Complaint: cough, sob Symptoms: coughing, yellow/white sputum Frequency: constant Pertinent Negatives: Patient denies fever, sob during day Disposition: [] ED /[] Urgent Care (no appt availability in office) / [x] Appointment(In office/virtual)/ []  [facility]/ [] Home Care/ [] Refused Recommended Disposition /[] [facility]/ []  Follow-up with PCP Additional Notes: Apt made for Thursday per patient's request.  Care advice given, denies questions, instructed to go to the er if becomes worse.   Reason for Disposition  Cough has been present for > 3 weeks  Answer Assessment - Initial Assessment Questions 1. ONSET: "When did the cough begin?"      Two weeks ago 2. SEVERITY: "How bad is the cough today?"      mild 3. SPUTUM: "Describe the color of your sputum" (none, dry cough; clear, white, yellow, green)     Yellow and clear 4. HEMOPTYSIS: "Are you coughing up any blood?" If so ask: "How much?" (flecks, streaks, tablespoons, etc.)     denies 5. DIFFICULTY BREATHING: "Are you having difficulty breathing?" If Yes, ask: "How bad is it?" (e.g., mild, moderate, severe)    - MILD: No SOB at rest, mild SOB with walking, speaks normally in sentences, can lie down, no retractions, pulse < 100.    - MODERATE: SOB at rest, SOB with minimal exertion and prefers to sit, cannot lie down flat, speaks in phrases, mild retractions, audible wheezing, pulse 100-120.    - SEVERE: Very SOB at rest, speaks in single words, struggling to breathe, sitting hunched forward, retractions, pulse > 120      5-6/10 6. FEVER: "Do you have a fever?" If Yes, ask: "What is your temperature, how was it measured, and when did it start?"      denies 7. CARDIAC HISTORY: "Do you have any history of heart disease?" (e.g., heart attack, congestive heart failure)      denies 8. LUNG HISTORY: "Do you have any history of lung disease?"  (e.g., pulmonary embolus, asthma, emphysema)     no 9. PE RISK FACTORS: "Do you have a history of blood clots?" (or: recent major surgery, recent prolonged travel, bedridden)     no 10. OTHER SYMPTOMS: "Do you have any other symptoms?" (e.g., runny nose, wheezing, chest pain)       Runny nose 11. PREGNANCY: "Is there any chance you are pregnant?" "When was your last menstrual period?"       na 12. TRAVEL: "Have you traveled out of the country in the last month?" (e.g., travel history, exposures)       Denies.  Protocols used: Cough - Acute Productive-A-AH

## 2024-01-10 NOTE — Telephone Encounter (Signed)
 Noted.

## 2024-01-13 ENCOUNTER — Ambulatory Visit (INDEPENDENT_AMBULATORY_CARE_PROVIDER_SITE_OTHER): Payer: 59 | Admitting: Physician Assistant

## 2024-01-13 ENCOUNTER — Other Ambulatory Visit: Payer: Self-pay | Admitting: Obstetrics and Gynecology

## 2024-01-13 VITALS — BP 128/76 | HR 82 | Temp 98.2°F | Resp 16 | Ht 62.5 in | Wt 155.0 lb

## 2024-01-13 DIAGNOSIS — R6889 Other general symptoms and signs: Secondary | ICD-10-CM | POA: Diagnosis not present

## 2024-01-13 DIAGNOSIS — Z0000 Encounter for general adult medical examination without abnormal findings: Secondary | ICD-10-CM

## 2024-01-13 LAB — POC INFLUENZA A&B (BINAX/QUICKVUE)
Influenza A, POC: NEGATIVE
Influenza B, POC: NEGATIVE

## 2024-01-13 LAB — POC COVID19 BINAXNOW: SARS Coronavirus 2 Ag: NEGATIVE

## 2024-01-13 NOTE — Patient Instructions (Signed)
It was great to see you!  Continue efforts at setting boundaries  Cough sounds like its improving -- keep me posted  Push fluids and rest as able for your diarrhea -- bland diet. Consider adding in imodium. If no improvement, we can order stool test - let me know.   Take care,  Jarold Motto PA-C

## 2024-01-13 NOTE — Progress Notes (Signed)
Andrea Rosario is a 43 y.o. female here for a new problem.  History of Present Illness:   Chief Complaint  Patient presents with   Cough    Patient reports having a cough for about 3 weeks   Diarrhea    Complains of diarrhea that started yesterday   Fatigue    Complains of feeling tired since yesterday   Cough  Patient complains of a dry cough that has persisted for the past 3 weeks. Cough has been improving. Husband is also exhibiting similar symptoms and onset.  Associated symptoms include diarrhea and fatigue that started yesterday and mild abdominal cramping.  She reports having about 5 episodes of diarrhea yesterday. Twice this morning. Doesn't believe there's a chance of food poisoning.  Reports maintaining good hydration. Has been taking dayquil with minimal relief. Denies any nausea, vomiting, fevers, blood in stool, sputum, UTI symptoms, and no recent travel. No personal history of asthma.  Flu and Covid in office today are negative.   Also reports experiencing stress due to a new job, consistently being busy, and feeling overwhelmed. She is tearful today.    Past Medical History:  Diagnosis Date   Vaginal delivery 03/25/2005, 06/19/2008     Social History   Tobacco Use   Smoking status: Never   Smokeless tobacco: Never  Vaping Use   Vaping status: Never Used  Substance Use Topics   Alcohol use: Yes    Alcohol/week: 1.0 standard drink of alcohol    Types: 1 Glasses of wine per week   Drug use: Never    Past Surgical History:  Procedure Laterality Date   BREAST BIOPSY Right    BREAST BIOPSY Left     Family History  Problem Relation Age of Onset   Breast cancer Mother 81   Diabetes Mother    Diabetes Father    Diabetes Sister    Colon cancer Neg Hx     No Known Allergies  Current Medications:   Current Outpatient Medications:    Calcium Carbonate-Vitamin D (CALTRATE 600+D PO), Take 1 tablet by mouth daily in the afternoon., Disp: , Rfl:     PREVIDENT 5000 BOOSTER PLUS 1.1 % PSTE, Place onto teeth., Disp: , Rfl:    Roflumilast, Antiseborrheic, (ZORYVE) 0.3 % FOAM, Apply 1 Application topically daily. As needed for itch, Disp: 60 g, Rfl: 2   Review of Systems:   Review of Systems  Constitutional:  Positive for malaise/fatigue. Negative for fever.  Respiratory:  Positive for cough. Negative for sputum production.   Gastrointestinal:  Positive for diarrhea. Negative for nausea and vomiting.    Vitals:   Vitals:   01/13/24 0955  BP: 128/76  Pulse: 82  Resp: 16  Temp: 98.2 F (36.8 C)  TempSrc: Temporal  SpO2: 98%  Weight: 155 lb (70.3 kg)  Height: 5' 2.5" (1.588 m)     Body mass index is 27.9 kg/m.  Physical Exam:   Physical Exam Vitals and nursing note reviewed.  Constitutional:      General: She is not in acute distress.    Appearance: She is well-developed. She is not ill-appearing or toxic-appearing.  HENT:     Head: Normocephalic and atraumatic.     Right Ear: Tympanic membrane, ear canal and external ear normal. Tympanic membrane is not erythematous, retracted or bulging.     Left Ear: Tympanic membrane, ear canal and external ear normal. Tympanic membrane is not erythematous, retracted or bulging.     Nose: Nose normal.  Right Sinus: No maxillary sinus tenderness or frontal sinus tenderness.     Left Sinus: No maxillary sinus tenderness or frontal sinus tenderness.     Mouth/Throat:     Pharynx: Uvula midline. No posterior oropharyngeal erythema.  Eyes:     General: Lids are normal.     Conjunctiva/sclera: Conjunctivae normal.  Neck:     Trachea: Trachea normal.  Cardiovascular:     Rate and Rhythm: Normal rate and regular rhythm.     Heart sounds: Normal heart sounds, S1 normal and S2 normal.  Pulmonary:     Effort: Pulmonary effort is normal.     Breath sounds: Normal breath sounds. No decreased breath sounds, wheezing, rhonchi or rales.  Lymphadenopathy:     Cervical: No cervical  adenopathy.  Skin:    General: Skin is warm and dry.  Neurological:     Mental Status: She is alert.  Psychiatric:        Mood and Affect: Affect is tearful.        Speech: Speech normal.        Behavior: Behavior normal. Behavior is cooperative.    Results for orders placed or performed in visit on 01/13/24  POC COVID-19  Result Value Ref Range   SARS Coronavirus 2 Ag Negative Negative  POC Influenza A&B (Binax test)  Result Value Ref Range   Influenza A, POC Negative Negative   Influenza B, POC Negative Negative    Assessment and Plan:   Flu-like symptoms No red flags on exam.   Suspect post-viral cough and new infectious diarrhea Discussed supportive care with rest as able, bland diet, fluids Consider sparing use of imodium Consider stool culture if symptom(s) persist Reviewed return precautions including new or worsening fever, SOB, new or worsening cough or other concerns.  Push fluids and rest.  I recommend that patient follow-up if symptoms worsen or persist despite treatment x 7-10 days, sooner if needed.   Jarold Motto, PA-C   I,Safa M Kadhim,acting as a scribe for Jarold Motto, PA.,have documented all relevant documentation on the behalf of Jarold Motto, PA,as directed by  Jarold Motto, PA while in the presence of Jarold Motto, [place].   I, Jarold Motto, [place], have reviewed all documentation for this visit. The documentation on 01/13/24 for the exam, diagnosis, procedures, and orders are all accurate and complete.

## 2024-02-04 ENCOUNTER — Ambulatory Visit
Admission: RE | Admit: 2024-02-04 | Discharge: 2024-02-04 | Disposition: A | Discharge status: Home / Self Care | Payer: 59 | Source: Ambulatory Visit | Attending: Obstetrics and Gynecology | Admitting: Obstetrics and Gynecology

## 2024-02-04 DIAGNOSIS — Z0000 Encounter for general adult medical examination without abnormal findings: Secondary | ICD-10-CM

## 2024-03-10 ENCOUNTER — Ambulatory Visit
Admission: RE | Admit: 2024-03-10 | Discharge: 2024-03-10 | Disposition: A | Discharge status: Home / Self Care | Source: Ambulatory Visit | Attending: Obstetrics and Gynecology | Admitting: Obstetrics and Gynecology

## 2024-07-04 ENCOUNTER — Encounter: Payer: Self-pay | Admitting: Dermatology

## 2024-07-04 ENCOUNTER — Ambulatory Visit: Payer: 59 | Admitting: Dermatology

## 2024-07-04 VITALS — BP 125/76

## 2024-07-04 DIAGNOSIS — L2989 Other pruritus: Secondary | ICD-10-CM

## 2024-07-04 DIAGNOSIS — L219 Seborrheic dermatitis, unspecified: Secondary | ICD-10-CM | POA: Diagnosis not present

## 2024-07-04 DIAGNOSIS — L299 Pruritus, unspecified: Secondary | ICD-10-CM

## 2024-07-04 MED ORDER — TRIAMCINOLONE ACETONIDE 10 MG/ML IJ SUSP
10.0000 mg | Freq: Once | INTRAMUSCULAR | Status: AC
  Administered 2024-07-04: 10 mg via INTRADERMAL

## 2024-07-04 NOTE — Progress Notes (Signed)
   Follow-Up Visit   Subjective  Andrea Rosario is a 43 y.o. female who presents for the following: Seb derm follow up - She is doing better. She still has some buildup on her scalp but it is not as bead. The itching is much better, maybe 3 out of 10. She is mixing T-sal and DHS zinc shampoos and washing hair weekly. She is only using Zoryve  once in a while (only once in last 2 months). She is using [employer] products and her hair feels healthier.   The following portions of the chart were reviewed this encounter and updated as appropriate: medications, allergies, medical history  Review of Systems:  No other skin or systemic complaints except as noted in HPI or Assessment and Plan.  Objective  Well appearing patient in no apparent distress; mood and affect are within normal limits.   A focused examination was performed of the following areas: Scalp  Relevant exam findings are noted in the Assessment and Plan.    Assessment & Plan   1. Seborrheic Dermatitis - Assessment: Patient reports approximately 60% improvement with current regimen. Minimal flaking observed on examination, with some buildup noted in the vertex area. Patient experiences itching, particularly in the middle of the scalp. Condition is noted to be chronic and may fluctuate with environmental factors, stress, and hormonal changes. There is concern for potential hair loss (CCCA) if inflammation persists unchecked.  - Plan:    Administered Kenalog  10 injections to 8 spots in the vertex area for chronic itching    Continue current regimen:     - T-Sal shampoo mixed with regular shampoo for each wash     - CeraVe zinc shampoo     - Zoryve  foam as needed, especially before wash days and when experiencing itching    Patient education provided on:     - Chronic nature of seborrheic dermatitis     - Potential for flares with seasonal changes, particularly in fall     - Importance of treating both itching and flaking  to prevent inflammation and potential hair loss    Monitor itching, aiming for complete resolution (from 4-5/10 to 0/10)  Follow-up in January for reassessment, particularly to evaluate the need for additional injections.   PRURITUS Scalp Intralesional injection - Scalp Location: Vertex scalp  Informed Consent: Discussed risks (infection, pain, bleeding, bruising, thinning of the skin, loss of skin pigment, lack of resolution, and recurrence of lesion) and benefits of the procedure, as well as the alternatives. Informed consent was obtained. Preparation: The area was prepared a standard fashion.  Anesthesia: none  Procedure Details: An intralesional injection was performed with Kenalog  10 mg/cc. 0.3 cc in total were injected. Lot # [id number] Exp 01/2025  Total number of injections: 8  Plan: The patient was instructed on post-op care. Recommend OTC analgesia as needed for pain.   Related Medications triamcinolone  acetonide (KENALOG ) 10 MG/ML injection 10 mg   Return in about 6 months (around 01/04/2025) for Se[facility] derm.  I, Roseline Hutchinson, CMA, am acting as scribe for [employer], DO .   Documentation: I have reviewed the above documentation for accuracy and completeness, and I agree with the above.  Delon Lenis, DO

## 2024-07-04 NOTE — Patient Instructions (Addendum)
 Date: Tue Jul 04 2024  Hello Andrea Rosario,  Thank you for visiting today. Here is a summary of the key instructions:  - Medications: Continue using Zoryve  foam once daily when itching or flaking occurs, especially before hair wash days  - Hair Care Routine:    - Use T-Sal shampoo mixed with DHS Zinc shampoo   - Use CeraVe zinc conditioner  - Scalp Treatment:    - Kenalog  injections were given in 8 spots on the vertex of the scalp   - Monitor for reduced itching over the next 2-3 days  - Follow-up:   - Next appointment in January to reassess scalp condition   - Contact the office if itching doesn't improve or worsens  We look forward to seeing you at your next visit. If you have any questions or concerns before then, please do not hesitate to contact our office.  Warm regards,  Dr. [facility]   Important Information  Due to recent changes in healthcare laws, you may see results of your pathology and/or laboratory studies on MyChart before the doctors have had a chance to review them. We understand that in some cases there may be results that are confusing or concerning to you. Please understand that not all results are received at the same time and often the doctors may need to interpret multiple results in order to provide you with the best plan of care or course of treatment. Therefore, we ask that you please give us  2 business days to thoroughly review all your results before contacting the office for clarification. Should we see a critical lab result, you will be contacted sooner.   If You Need Anything After Your Visit  If you have any questions or concerns for your doctor, please call our main line at [phone] If no one answers, please leave a voicemail as directed and we will return your call as soon as possible. Messages left after 4 pm will be answered the following business day.   You may also send us  a message via MyChart. We typically respond to  MyChart messages within 1-2 business days.  For prescription refills, please ask your pharmacy to contact our office. Our fax number is [phone].  If you have an urgent issue when the clinic is closed that cannot wait until the next business day, you can page your doctor at the number below.    Please note that while we do our best to be available for urgent issues outside of office hours, we are not available 24/7.   If you have an urgent issue and are unable to reach us , you may choose to seek medical care at your doctor's office, retail clinic, urgent care center, or emergency room.  If you have a medical emergency, please immediately call 911 or go to the emergency department. In the event of inclement weather, please call our main line at [phone] for an update on the status of any delays or closures.  Dermatology Medication Tips: Please keep the boxes that topical medications come in in order to help keep track of the instructions about where and how to use these. Pharmacies typically print the medication instructions only on the boxes and not directly on the medication tubes.   If your medication is too expensive, please contact our office at [phone] or send us  a message through MyChart.   We are unable to tell what your co-pay for medications will be in advance as this is different depending on  your insurance coverage. However, we may be able to find a substitute medication at lower cost or fill out paperwork to get insurance to cover a needed medication.   If a prior authorization is required to get your medication covered by your insurance company, please allow us  1-2 business days to complete this process.  Drug prices often vary depending on where the prescription is filled and some pharmacies may offer cheaper prices.  The website www.goodrx.com contains coupons for medications through different pharmacies. The prices here do not account for what the cost may be with  help from insurance (it may be cheaper with your insurance), but the website can give you the price if you did not use any insurance.  - You can print the associated coupon and take it with your prescription to the pharmacy.  - You may also stop by our office during regular business hours and pick up a GoodRx coupon card.  - If you need your prescription sent electronically to a different pharmacy, notify our office through [facility] or by phone at [phone]

## 2024-07-21 ENCOUNTER — Other Ambulatory Visit: Payer: Self-pay | Admitting: Dermatology

## 2024-07-21 DIAGNOSIS — L219 Seborrheic dermatitis, unspecified: Secondary | ICD-10-CM

## 2024-10-11 ENCOUNTER — Other Ambulatory Visit: Payer: Self-pay | Admitting: Obstetrics and Gynecology

## 2024-10-11 DIAGNOSIS — Z803 Family history of malignant neoplasm of breast: Secondary | ICD-10-CM

## 2024-11-06 ENCOUNTER — Encounter: Payer: Self-pay | Admitting: Obstetrics and Gynecology

## 2024-11-08 ENCOUNTER — Encounter: Payer: Self-pay | Admitting: Obstetrics and Gynecology

## 2024-11-09 ENCOUNTER — Encounter: Payer: Self-pay | Admitting: Obstetrics and Gynecology

## 2024-11-12 ENCOUNTER — Inpatient Hospital Stay: Admission: RE | Admit: 2024-11-12 | Discharge: 2024-11-12 | Attending: Obstetrics and Gynecology

## 2024-11-12 DIAGNOSIS — Z803 Family history of malignant neoplasm of breast: Secondary | ICD-10-CM

## 2024-11-12 MED ORDER — GADOPICLENOL 0.5 MMOL/ML IV SOLN
6.0000 mL | Freq: Once | INTRAVENOUS | Status: AC | PRN
  Administered 2024-11-12: 15:00:00 6 mL via INTRAVENOUS

## 2024-11-14 ENCOUNTER — Other Ambulatory Visit: Payer: Self-pay | Admitting: Obstetrics and Gynecology

## 2024-11-14 DIAGNOSIS — R9389 Abnormal findings on diagnostic imaging of other specified body structures: Secondary | ICD-10-CM

## 2024-12-04 ENCOUNTER — Ambulatory Visit
Admission: RE | Admit: 2024-12-04 | Discharge: 2024-12-04 | Disposition: A | Discharge status: Home / Self Care | Source: Ambulatory Visit | Attending: Obstetrics and Gynecology | Admitting: Obstetrics and Gynecology

## 2024-12-04 ENCOUNTER — Other Ambulatory Visit: Payer: Self-pay | Admitting: Obstetrics and Gynecology

## 2024-12-04 DIAGNOSIS — R9389 Abnormal findings on diagnostic imaging of other specified body structures: Secondary | ICD-10-CM

## 2024-12-04 DIAGNOSIS — N6321 Unspecified lump in the left breast, upper outer quadrant: Secondary | ICD-10-CM

## 2024-12-04 HISTORY — PX: BREAST BIOPSY: SHX20

## 2024-12-07 LAB — SURGICAL PATHOLOGY

## 2024-12-15 ENCOUNTER — Encounter

## 2024-12-15 ENCOUNTER — Other Ambulatory Visit

## 2025-01-04 ENCOUNTER — Ambulatory Visit: Admitting: Dermatology

## 2025-02-01 ENCOUNTER — Ambulatory Visit: Admitting: Dermatology
